# Patient Record
Sex: Male | Born: 1970 | Race: Black or African American | Hispanic: No | Marital: Single | Smoking: Current every day smoker
Health system: Southern US, Community
[De-identification: ages and names within clinical notes are randomized; demographics above are authoritative.]

## PROBLEM LIST (undated history)

## (undated) DIAGNOSIS — F4310 Post-traumatic stress disorder, unspecified: Secondary | ICD-10-CM

## (undated) DIAGNOSIS — F32A Depression, unspecified: Secondary | ICD-10-CM

## (undated) DIAGNOSIS — F329 Major depressive disorder, single episode, unspecified: Secondary | ICD-10-CM

## (undated) DIAGNOSIS — M542 Cervicalgia: Secondary | ICD-10-CM

## (undated) DIAGNOSIS — F419 Anxiety disorder, unspecified: Secondary | ICD-10-CM

---

## 2005-03-21 ENCOUNTER — Emergency Department (HOSPITAL_COMMUNITY): Admission: EM | Admit: 2005-03-21 | Discharge: 2005-03-21 | Payer: Self-pay | Admitting: Emergency Medicine

## 2012-05-28 ENCOUNTER — Encounter (HOSPITAL_BASED_OUTPATIENT_CLINIC_OR_DEPARTMENT_OTHER): Payer: Self-pay | Admitting: *Deleted

## 2012-05-28 ENCOUNTER — Emergency Department (HOSPITAL_BASED_OUTPATIENT_CLINIC_OR_DEPARTMENT_OTHER)
Admission: EM | Admit: 2012-05-28 | Discharge: 2012-05-29 | Disposition: A | Discharge status: Home / Self Care | Payer: Self-pay | Attending: Emergency Medicine | Admitting: Emergency Medicine

## 2012-05-28 DIAGNOSIS — R51 Headache: Secondary | ICD-10-CM | POA: Insufficient documentation

## 2012-05-28 DIAGNOSIS — M542 Cervicalgia: Secondary | ICD-10-CM | POA: Insufficient documentation

## 2012-05-28 HISTORY — DX: Post-traumatic stress disorder, unspecified: F43.10

## 2012-05-28 HISTORY — DX: Anxiety disorder, unspecified: F41.9

## 2012-05-28 HISTORY — DX: Depression, unspecified: F32.A

## 2012-05-28 HISTORY — DX: Major depressive disorder, single episode, unspecified: F32.9

## 2012-05-28 NOTE — ED Notes (Signed)
Pt reports he "feels like something is sliding" inside his head- also has had neck pain x 2 weeks which is worse over the four days and keeping from sleep- also c/o fatigue

## 2012-05-28 NOTE — ED Notes (Signed)
Pt reports feeling like something is sliding around in his head x 2 weeks,  Pt reports sever pain when moving head from side to side, has to move whole body, now feels like he is developing anxiety from headache. Pt reports history of head injury when in prison 10 years ago, pt's main issue is the pressure he feels inside of his head, neurologically intact,

## 2012-05-29 ENCOUNTER — Emergency Department (HOSPITAL_BASED_OUTPATIENT_CLINIC_OR_DEPARTMENT_OTHER): Payer: Self-pay

## 2012-05-29 LAB — CBC WITH DIFFERENTIAL/PLATELET
Basophils Absolute: 0.1 10*3/uL (ref 0.0–0.1)
Basophils Relative: 0 % (ref 0–1)
Eosinophils Absolute: 0.1 10*3/uL (ref 0.0–0.7)
Hemoglobin: 14.9 g/dL (ref 13.0–17.0)
MCH: 30.3 pg (ref 26.0–34.0)
MCHC: 37 g/dL — ABNORMAL HIGH (ref 30.0–36.0)
Monocytes Relative: 9 % (ref 3–12)
Neutro Abs: 8.5 10*3/uL — ABNORMAL HIGH (ref 1.7–7.7)
Neutrophils Relative %: 65 % (ref 43–77)
Platelets: 281 10*3/uL (ref 150–400)

## 2012-05-29 LAB — BASIC METABOLIC PANEL
Chloride: 105 mEq/L (ref 96–112)
GFR calc Af Amer: >90 mL/min (ref >90)
GFR calc non Af Amer: >90 mL/min (ref >90)
Potassium: 4 mEq/L (ref 3.5–5.1)
Sodium: 139 mEq/L (ref 135–145)

## 2012-05-29 MED ORDER — HYDROCODONE-ACETAMINOPHEN 5-325 MG PO TABS: 1.0000 | ORAL_TABLET | Freq: Four times a day (QID) | ORAL | Status: AC | PRN

## 2012-05-29 NOTE — ED Notes (Signed)
MD at bedside. 

## 2012-05-29 NOTE — ED Provider Notes (Signed)
History     CSN: [id number]  Arrival date & time 05/28/12  2324   First MD Initiated Contact with Patient 05/29/12 0050      Chief Complaint  Patient presents with  . Head and neck pain     (Consider location/radiation/quality/duration/timing/severity/associated sxs/prior treatment) HPI This is a 41 year old male with a one-week history of headaches and neck pain. The headaches were initially intermittent and mild but for the past 4 days have been constant. He describes the pain is severe. He describes the sensation as that of something sliding around in his head, specifically pointing to his temples. He states he is also having sinus tenderness consistent with prior episodes of sinusitis. He also states he has pain in his neck, worse with movement of the neck but not with palpation; he states this feels like muscle spasms. He denies any focal neurologic deficits. He denies fever or chills. He denies chest pain, dyspnea, abdominal pain, nausea or vomiting. He states he has felt generally weak for the past 2 months and has had intermittent diarrhea as well as constipation.  Past Medical History  Diagnosis Date  . Anxiety   . Depression   . PTSD (post-traumatic stress disorder)     History reviewed. No pertinent past surgical history.  No family history on file.  History  Substance Use Topics  . Smoking status: Current Everyday Smoker  . Smokeless tobacco: Never Used  . Alcohol Use: No      Review of Systems  All other systems reviewed and are negative.    Allergies  Review of patient's allergies indicates no known allergies.  Home Medications  No current outpatient prescriptions on file.  BP 147/83  Pulse 108  Temp 98.6 F (37 C) (Oral)  Resp 20  SpO2 98%  Physical Exam General: Well-developed, well-nourished male in no acute distress; appearance consistent with age of record HENT: normocephalic, atraumatic Eyes: pupils equal round and reactive to light;  extraocular muscles intact Neck: no soft tissue tenderness or muscle spasm palpated; pain on range of motion; no meningeal signs Heart: regular rate and rhythm Lungs: clear to auscultation bilaterally Abdomen: soft; nondistended; nontender; no masses or hepatosplenomegaly; bowel sounds present Extremities: No deformity; full range of motion\ Neurologic: Awake, alert and oriented; motor function intact in all extremities and symmetric; no facial droop Skin: Warm and dry Psychiatric: Flat affect    ED Course  Procedures (including critical care time)    MDM   Nursing notes and vitals signs, including pulse oximetry, reviewed.  Summary of this visit's results, reviewed by myself:  Labs:  Results for orders placed during the hospital encounter of 05/28/12  CBC WITH DIFFERENTIAL      Component Value Range   WBC 13.0 (*) 4.0 - 10.5 K/uL   RBC 4.92  4.22 - 5.81 MIL/uL   Hemoglobin 14.9  13.0 - 17.0 g/dL   HCT 78.2  95.6 - 21.3 %   MCV 81.9  78.0 - 100.0 fL   MCH 30.3  26.0 - 34.0 pg   MCHC 37.0 (*) 30.0 - 36.0 g/dL   RDW 08.6  57.8 - 46.9 %   Platelets 281  150 - 400 K/uL   Neutrophils Relative 65  43 - 77 %   Neutro Abs 8.5 (*) 1.7 - 7.7 K/uL   Lymphocytes Relative 25  12 - 46 %   Lymphs Abs 3.2  0.7 - 4.0 K/uL   Monocytes Relative 9  3 - 12 %  Monocytes Absolute 1.2 (*) 0.1 - 1.0 K/uL   Eosinophils Relative 1  0 - 5 %   Eosinophils Absolute 0.1  0.0 - 0.7 K/uL   Basophils Relative 0  0 - 1 %   Basophils Absolute 0.1  0.0 - 0.1 K/uL  BASIC METABOLIC PANEL      Component Value Range   Sodium 139  135 - 145 mEq/L   Potassium 4.0  3.5 - 5.1 mEq/L   Chloride 105  96 - 112 mEq/L   CO2 22  19 - 32 mEq/L   Glucose, Bld 103 (*) 70 - 99 mg/dL   BUN 23  6 - 23 mg/dL   Creatinine, Ser 1.61  0.50 - 1.35 mg/dL   Calcium 9.7  8.4 - 09.6 mg/dL   GFR calc non Af Amer >90  >90 mL/min   GFR calc Af Amer >90  >90 mL/min    Imaging Studies: Ct Head Wo Contrast  05/29/2012   *RADIOLOGY REPORT*  Clinical Data: Headache  CT HEAD WITHOUT CONTRAST  Technique:  Contiguous axial images were obtained from the base of the skull through the vertex without contrast.  Comparison: None.  Findings: No acute hemorrhage, acute infarction, or mass lesion is identified.  No midline shift.  No ventriculomegaly.  No skull fracture.  Orbits and paranasal sinuses are unremarkable.  IMPRESSION: No acute intracranial finding.  Original Report Authenticated By: Harrel Lemon, M.D.            Hanley Seamen, MD 05/29/12 [phone]

## 2012-08-07 ENCOUNTER — Emergency Department (HOSPITAL_BASED_OUTPATIENT_CLINIC_OR_DEPARTMENT_OTHER)
Admission: EM | Admit: 2012-08-07 | Discharge: 2012-08-07 | Disposition: A | Discharge status: Home / Self Care | Payer: Self-pay | Attending: Emergency Medicine | Admitting: Emergency Medicine

## 2012-08-07 ENCOUNTER — Emergency Department (HOSPITAL_BASED_OUTPATIENT_CLINIC_OR_DEPARTMENT_OTHER): Payer: Self-pay

## 2012-08-07 ENCOUNTER — Encounter (HOSPITAL_BASED_OUTPATIENT_CLINIC_OR_DEPARTMENT_OTHER): Payer: Self-pay | Admitting: Family Medicine

## 2012-08-07 DIAGNOSIS — F329 Major depressive disorder, single episode, unspecified: Secondary | ICD-10-CM | POA: Insufficient documentation

## 2012-08-07 DIAGNOSIS — J40 Bronchitis, not specified as acute or chronic: Secondary | ICD-10-CM | POA: Insufficient documentation

## 2012-08-07 DIAGNOSIS — F3289 Other specified depressive episodes: Secondary | ICD-10-CM | POA: Insufficient documentation

## 2012-08-07 DIAGNOSIS — F411 Generalized anxiety disorder: Secondary | ICD-10-CM | POA: Insufficient documentation

## 2012-08-07 DIAGNOSIS — F17200 Nicotine dependence, unspecified, uncomplicated: Secondary | ICD-10-CM | POA: Insufficient documentation

## 2012-08-07 DIAGNOSIS — J329 Chronic sinusitis, unspecified: Secondary | ICD-10-CM | POA: Insufficient documentation

## 2012-08-07 DIAGNOSIS — F4310 Post-traumatic stress disorder, unspecified: Secondary | ICD-10-CM | POA: Insufficient documentation

## 2012-08-07 HISTORY — DX: Cervicalgia: M54.2

## 2012-08-07 LAB — URINALYSIS, ROUTINE W REFLEX MICROSCOPIC
Bilirubin Urine: NEGATIVE
Leukocytes, UA: NEGATIVE
Nitrite: NEGATIVE
Specific Gravity, Urine: 1.021 (ref 1.005–1.030)
Urobilinogen, UA: 0.2 mg/dL (ref 0.0–1.0)
pH: 5.5 (ref 5.0–8.0)

## 2012-08-07 MED ORDER — ALBUTEROL SULFATE HFA 108 (90 BASE) MCG/ACT IN AERS
2.0000 | INHALATION_SPRAY | RESPIRATORY_TRACT | Status: DC | PRN
  Filled 2012-08-07: qty 6.7

## 2012-08-07 MED ORDER — ALBUTEROL SULFATE (5 MG/ML) 0.5% IN NEBU
5.0000 mg | INHALATION_SOLUTION | Freq: Once | RESPIRATORY_TRACT | Status: AC
  Administered 2012-08-07: 5 mg via RESPIRATORY_TRACT
  Filled 2012-08-07: qty 1

## 2012-08-07 MED ORDER — AMOXICILLIN 500 MG PO CAPS: 500.0000 mg | ORAL_CAPSULE | Freq: Three times a day (TID) | ORAL | Status: DC

## 2012-08-07 NOTE — ED Notes (Signed)
Pt sts he is concerned about an infection in urine and has been taking flagyl for exposure to std.

## 2012-08-07 NOTE — ED Notes (Signed)
Pt was instructed on proper MDI use with spacer. He currently has no questions. He understands it's use and demonstrated it well. Rt will continue to monitor.

## 2012-08-07 NOTE — ED Provider Notes (Signed)
History     CSN: [id number]  Arrival date & time 08/07/12  4098   First MD Initiated Contact with Patient 08/07/12 1039      Chief Complaint  Patient presents with  . Generalized Body Aches    (Consider location/radiation/quality/duration/timing/severity/associated sxs/prior treatment) HPI Pt presents with c/o cough, sinus congestion, facial pain and headache.  Symptoms have been present over the past 6 days.  Past 2 days he has developed a fever.  No vomiting.  He has tried sudafed OTC and this has provided some mild relief.  Pt is a smoker.  No chest pain or sob.  No known sick contacts.  There are no other associated systemic symptoms, there are no other alleviating or modifying factors.   Past Medical History  Diagnosis Date  . Anxiety   . Depression   . PTSD (post-traumatic stress disorder)   . Neck pain     History reviewed. No pertinent past surgical history.  No family history on file.  History  Substance Use Topics  . Smoking status: Current Every Day Smoker  . Smokeless tobacco: Never Used  . Alcohol Use: No      Review of Systems ROS reviewed and all otherwise negative except for mentioned in HPI  Allergies  Review of patient's allergies indicates no known allergies.  Home Medications   Current Outpatient Rx  Name Route Sig Dispense Refill  . HYDROCODONE-ACETAMINOPHEN 5-325 MG PO TABS Oral Take 1 tablet by mouth every 6 (six) hours as needed.    . FLAGYL PO Oral Take by mouth.    . NYQUIL PO Oral Take by mouth.    Marland Kitchen PSEUDOEPHEDRINE HCL PO Oral Take by mouth.    . AMOXICILLIN 500 MG PO CAPS Oral Take 1 capsule (500 mg total) by mouth 3 (three) times daily. 21 capsule 0    BP 139/93  Pulse 85  Temp 98 F (36.7 C) (Oral)  Resp 20  Ht 5\' 11"  (1.803 m)  Wt 231 lb (104.781 kg)  BMI 32.22 kg/m2  SpO2 100% Vitals reviewed Physical Exam Physical Examination: General appearance - alert, well appearing, and in no distress Mental status - alert,  oriented to person, place, and time Eyes - pupils equal and reactive, extraocular eye movements intact Mouth - mucous membranes moist, pharynx normal without lesions Neck - supple, no significant adenopathy Chest - bilateral expiratory coarse wheeing, no retraction or increased respiratory effort, no crackles or rhonchi Heart - normal rate, regular rhythm, normal S1, S2, no murmurs, rubs, clicks or gallops Abdomen - soft, nontender, nondistended, no masses or organomegaly, nabs Extremities - peripheral pulses normal, no pedal edema, no clubbing or cyanosis Skin - normal coloration and turgor, no rashes  ED Course  Procedures (including critical care time)   Labs Reviewed  URINALYSIS, ROUTINE W REFLEX MICROSCOPIC   Dg Chest 2 View  08/07/2012  *RADIOLOGY REPORT*  Clinical Data: Generalized body aches.  CHEST - 2 VIEW  Comparison: None.  Findings: Mild central peribronchial thickening.  No confluent airspace opacity.  No pleural effusion or pneumothorax. Cardiomediastinal contours within normal range.  No acute osseous finding.  IMPRESSION: Mild peribronchial thickening can be seen with viral infection or bronchitis.  No confluent airspace opacity.   Original Report Authenticated By: Waneta Martins, M.D.      1. Bronchitis   2. Sinusitis       MDM  Pt presents with c/o cough, nasal congestion, facial pain.  He has wheezing on exam- advised to  stop smoking.  Pt given albuterol neb, also given albuterol MDI.  Also ttp over frontal and maxillary sinuses.  Pt started on amoxicillin for sinusitis as well.  Discharged with strict return precautions.  Pt agreeable with plan.        Ethelda Chick, MD 08/07/12 1141

## 2012-08-07 NOTE — ED Notes (Signed)
Pt c/o fever at home, body aches, nasal congestion and 2 episodes of diarrhea. Pt also c/o cough and sts "I hope I don't have pneumonia".

## 2012-10-13 ENCOUNTER — Emergency Department (HOSPITAL_BASED_OUTPATIENT_CLINIC_OR_DEPARTMENT_OTHER)
Admission: EM | Admit: 2012-10-13 | Discharge: 2012-10-13 | Discharge status: Against Medical Advice | Payer: Self-pay | Attending: Emergency Medicine | Admitting: Emergency Medicine

## 2012-10-13 DIAGNOSIS — Z029 Encounter for administrative examinations, unspecified: Secondary | ICD-10-CM | POA: Insufficient documentation

## 2012-10-14 ENCOUNTER — Emergency Department (HOSPITAL_COMMUNITY)
Admission: EM | Admit: 2012-10-14 | Discharge: 2012-10-14 | Disposition: A | Discharge status: Home / Self Care | Payer: Medicaid Other | Source: Home / Self Care | Attending: Emergency Medicine | Admitting: Emergency Medicine

## 2012-10-14 ENCOUNTER — Encounter (HOSPITAL_COMMUNITY): Payer: Self-pay | Admitting: Emergency Medicine

## 2012-10-14 DIAGNOSIS — J329 Chronic sinusitis, unspecified: Secondary | ICD-10-CM

## 2012-10-14 MED ORDER — FLUTICASONE PROPIONATE 50 MCG/ACT NA SUSP: 2.0000 | Freq: Every day | NASAL | Status: AC

## 2012-10-14 MED ORDER — AMOXICILLIN-POT CLAVULANATE 875-125 MG PO TABS: 1.0000 | ORAL_TABLET | Freq: Two times a day (BID) | ORAL | Status: DC

## 2012-10-14 NOTE — ED Provider Notes (Signed)
History     CSN: [id number]  Arrival date & time 10/14/12  1252   First MD Initiated Contact with Patient 10/14/12 1502      Chief Complaint  Patient presents with  . Facial Pain    sinus pressure, yellow/green nasal drainage, headache, left ear pain    (Consider location/radiation/quality/duration/timing/severity/associated sxs/prior treatment) HPI Comments: Pt with sinus pain and pressure, getting worse for 2.5 weeks. C/o green/brown sputum/discharge.   Patient is a 41 y.o. male presenting with URI. The history is provided by the patient.  URI The primary symptoms include headaches. Primary symptoms do not include fever, ear pain, sore throat or cough. Episode onset: 2.5 weeks ago. This is a new problem. The problem has been gradually worsening.  Symptoms associated with the illness include chills, facial pain, sinus pressure and congestion.    Past Medical History  Diagnosis Date  . Anxiety   . Depression   . PTSD (post-traumatic stress disorder)   . Neck pain     History reviewed. No pertinent past surgical history.  History reviewed. No pertinent family history.  History  Substance Use Topics  . Smoking status: Current Every Day Smoker  . Smokeless tobacco: Never Used  . Alcohol Use: No      Review of Systems  Constitutional: Positive for chills. Negative for fever.  HENT: Positive for congestion and sinus pressure. Negative for ear pain and sore throat.   Respiratory: Negative for cough.   Neurological: Positive for headaches.    Allergies  Review of patient's allergies indicates no known allergies.  Home Medications   Current Outpatient Rx  Name  Route  Sig  Dispense  Refill  . AMOXICILLIN 500 MG PO CAPS   Oral   Take 1 capsule (500 mg total) by mouth 3 (three) times daily.   21 capsule   0   . AMOXICILLIN-POT CLAVULANATE 875-125 MG PO TABS   Oral   Take 1 tablet by mouth 2 (two) times daily.   20 tablet   0   . FLUTICASONE PROPIONATE 50  MCG/ACT NA SUSP   Nasal   Place 2 sprays into the nose daily. Into each nostril   16 g   2   . HYDROCODONE-ACETAMINOPHEN 5-325 MG PO TABS   Oral   Take 1 tablet by mouth every 6 (six) hours as needed.         . FLAGYL PO   Oral   Take by mouth.         . NYQUIL PO   Oral   Take by mouth.         Marland Kitchen PSEUDOEPHEDRINE HCL PO   Oral   Take by mouth.           BP 137/89  Pulse 80  Temp 98.4 F (36.9 C) (Oral)  Resp 16  SpO2 97%  Physical Exam  Constitutional: He appears well-developed and well-nourished.       Uncomfortable appearing  HENT:  Right Ear: Tympanic membrane, external ear and ear canal normal.  Left Ear: Tympanic membrane, external ear and ear canal normal.  Nose: Mucosal edema present. Right sinus exhibits maxillary sinus tenderness and frontal sinus tenderness. Left sinus exhibits maxillary sinus tenderness and frontal sinus tenderness.  Mouth/Throat: Oropharynx is clear and moist.  Cardiovascular: Normal rate and regular rhythm.   Pulmonary/Chest: Effort normal.    ED Course  Procedures (including critical care time)  Labs Reviewed - No data to display No results found.  1. Sinusitis       MDM         Cathlyn Parsons, NP 10/14/12 1507

## 2012-10-14 NOTE — ED Notes (Addendum)
Pt c/o sinus pressure with green/yellow nasal drainage and headache. Left ear pain and stuffiness, fatigue,dizziness.  Nausea from nasal drainage. Symptoms are gradually getting worse. Pt denies fever and any other symptoms.   Pt states was treated in oct. For similar symptoms and was given an antibiotic and sudafed with only mild relief in symptoms.  Symptoms have been getting progressively worse over the past two weeks

## 2012-10-14 NOTE — ED Provider Notes (Signed)
Medical screening examination/treatment/procedure(s) were performed by non-physician practitioner and as supervising physician I was immediately available for consultation/collaboration.  Raynald Blend, MD 10/14/12 [phone]

## 2013-02-01 ENCOUNTER — Emergency Department (INDEPENDENT_AMBULATORY_CARE_PROVIDER_SITE_OTHER)
Admission: EM | Admit: 2013-02-01 | Discharge: 2013-02-01 | Disposition: A | Discharge status: Home / Self Care | Payer: Medicaid Other | Source: Home / Self Care | Attending: Family Medicine | Admitting: Family Medicine

## 2013-02-01 ENCOUNTER — Emergency Department (INDEPENDENT_AMBULATORY_CARE_PROVIDER_SITE_OTHER): Payer: Medicaid Other

## 2013-02-01 ENCOUNTER — Encounter (HOSPITAL_COMMUNITY): Payer: Self-pay | Admitting: Emergency Medicine

## 2013-02-01 DIAGNOSIS — J329 Chronic sinusitis, unspecified: Secondary | ICD-10-CM

## 2013-02-01 DIAGNOSIS — J029 Acute pharyngitis, unspecified: Secondary | ICD-10-CM

## 2013-02-01 MED ORDER — OMEPRAZOLE 20 MG PO CPDR: 20.0000 mg | DELAYED_RELEASE_CAPSULE | Freq: Every day | ORAL | Status: DC

## 2013-02-01 MED ORDER — GI COCKTAIL ~~LOC~~
ORAL | Status: AC
  Filled 2013-02-01: qty 30

## 2013-02-01 MED ORDER — PREDNISONE 20 MG PO TABS: ORAL_TABLET | ORAL | Status: AC

## 2013-02-01 MED ORDER — AMOXICILLIN 500 MG PO CAPS: 500.0000 mg | ORAL_CAPSULE | Freq: Three times a day (TID) | ORAL | Status: AC

## 2013-02-01 MED ORDER — GI COCKTAIL ~~LOC~~
30.0000 mL | Freq: Once | ORAL | Status: AC
  Administered 2013-02-01: 30 mL via ORAL

## 2013-02-01 MED ORDER — HYDROCODONE-ACETAMINOPHEN 5-325 MG PO TABS: 2.0000 | ORAL_TABLET | ORAL | Status: DC | PRN

## 2013-02-01 NOTE — ED Notes (Signed)
Pt c/o ear ache x 2 to 3 days and sore throat. Pt states that two hours ago he started to have sharp pain with swallowing  And feels like throat is closing. Symptoms are gradually getting worse.  Pt denies fever.sob.cough. Pt is sitting up right and still experiencing pain with swallowing.   Provider notified of pt's condition.

## 2013-02-02 ENCOUNTER — Emergency Department (HOSPITAL_BASED_OUTPATIENT_CLINIC_OR_DEPARTMENT_OTHER)
Admission: EM | Admit: 2013-02-02 | Discharge: 2013-02-02 | Disposition: A | Discharge status: Home / Self Care | Payer: Medicaid Other | Attending: Emergency Medicine | Admitting: Emergency Medicine

## 2013-02-02 ENCOUNTER — Encounter (HOSPITAL_BASED_OUTPATIENT_CLINIC_OR_DEPARTMENT_OTHER): Payer: Self-pay | Admitting: Emergency Medicine

## 2013-02-02 DIAGNOSIS — Z8659 Personal history of other mental and behavioral disorders: Secondary | ICD-10-CM | POA: Insufficient documentation

## 2013-02-02 DIAGNOSIS — F17200 Nicotine dependence, unspecified, uncomplicated: Secondary | ICD-10-CM | POA: Insufficient documentation

## 2013-02-02 DIAGNOSIS — G8929 Other chronic pain: Secondary | ICD-10-CM | POA: Insufficient documentation

## 2013-02-02 DIAGNOSIS — J029 Acute pharyngitis, unspecified: Secondary | ICD-10-CM | POA: Insufficient documentation

## 2013-02-02 DIAGNOSIS — M542 Cervicalgia: Secondary | ICD-10-CM | POA: Insufficient documentation

## 2013-02-02 MED ORDER — GI COCKTAIL ~~LOC~~: 30.0000 mL | Freq: Once | ORAL | Status: DC

## 2013-02-02 NOTE — ED Notes (Signed)
MD at bedside. 

## 2013-02-02 NOTE — ED Notes (Signed)
Pt c/o sinus drainage, pt educated about taking Claritin or sudafed OTC to help with sinus drainage.

## 2013-02-02 NOTE — ED Provider Notes (Signed)
History     CSN: [id number]  Arrival date & time 02/01/13  4098   First MD Initiated Contact with Patient 02/01/13 1814      Chief Complaint  Patient presents with  . Oral Swelling    (Consider location/radiation/quality/duration/timing/severity/associated sxs/prior treatment) HPI Comments: 42 y/o male smoker with h/o recurrent sinusitis here c/o sinus pressure, rhinorrhea, headache, sore throat and pain with swallowing for 2 days. Pain with swallowing started few hours ago and feels his "throat is closing". Denies cough, chest pain or shortness of breath. No fever or chills. Denies h/o of swallowing a foreign body.   Past Medical History  Diagnosis Date  . Anxiety   . Depression   . PTSD (post-traumatic stress disorder)   . Neck pain     History reviewed. No pertinent past surgical history.  History reviewed. No pertinent family history.  History  Substance Use Topics  . Smoking status: Current Every Day Smoker  . Smokeless tobacco: Never Used  . Alcohol Use: No      Review of Systems  Constitutional: Negative for fever, chills, diaphoresis, activity change, appetite change and fatigue.  HENT: Positive for congestion, sore throat, trouble swallowing and sinus pressure.   Respiratory: Negative for cough, chest tightness, shortness of breath, wheezing and stridor.   Gastrointestinal: Negative for nausea, vomiting and abdominal pain.    Allergies  Review of patient's allergies indicates no known allergies.  Home Medications   Current Outpatient Rx  Name  Route  Sig  Dispense  Refill  . amoxicillin (AMOXIL) 500 MG capsule   Oral   Take 1 capsule (500 mg total) by mouth 3 (three) times daily.   21 capsule   0   . fluticasone (FLONASE) 50 MCG/ACT nasal spray   Nasal   Place 2 sprays into the nose daily. Into each nostril   16 g   2   . HYDROcodone-acetaminophen (NORCO/VICODIN) 5-325 MG per tablet   Oral   Take 2 tablets by mouth every 4 (four) hours as  needed for pain.   6 tablet   0   . omeprazole (PRILOSEC) 20 MG capsule   Oral   Take 1 capsule (20 mg total) by mouth daily.   30 capsule   0   . predniSONE (DELTASONE) 20 MG tablet      2 tabs by mouth daily for 5 days   10 tablet   0     BP 151/100  Pulse 108  Temp(Src) 98.2 F (36.8 C) (Oral)  Resp 20  SpO2 100%  Physical Exam  Nursing note and vitals reviewed. Constitutional: He is oriented to person, place, and time. He appears well-developed and well-nourished. No distress.  HENT:  Head: Normocephalic and atraumatic.  Right Ear: External ear normal.  Left Ear: External ear normal.  Mouth/Throat: Oropharynx is clear and moist.  Nasal Congestion with erythema and swelling of nasal turbinates, white rhinorrhea. Mild pharyngeal erythema no exudates. No uvula deviation. No trismus. No drooling. TM's normal.  Neck: Neck supple. No JVD present. No tracheal deviation present. No thyromegaly present.  Cardiovascular: Normal rate, regular rhythm and normal heart sounds.   Pulmonary/Chest: Effort normal and breath sounds normal. No respiratory distress. He has no wheezes. He has no rales. He exhibits no tenderness.  Lymphadenopathy:    He has no cervical adenopathy.  Neurological: He is alert and oriented to person, place, and time.  Skin: No rash noted. He is not diaphoretic.    ED Course  Procedures (including critical care time)  Labs Reviewed  POCT RAPID STREP A ([facility] ONLY)   Dg Neck Soft Tissue  02/01/2013  *RADIOLOGY REPORT*  Clinical Data: Throat pain and swelling.  Foreign body sensation.  NECK SOFT TISSUES - 1+ VIEW  Comparison: None  Findings: The prevertebral soft tissues, epiglottis and aryepiglottic folds are unremarkable. There is no evidence of radiopaque foreign body. The visualized cervical spine is unremarkable. No significant abnormalities are noted.  IMPRESSION: Unremarkable exam.   Original Report Authenticated By: Harmon Pier, M.D.       1. Sinusitis   2. Pharyngitis       MDM  normal soft tissue neck Xrays. Negative strep.  Patient reports improvement of dysphagia after lidocaine containing GI cocktail. Possible pharyngeal erosion or esophageal spasms in this smoker patient. Prescribed omeprazole. Sinusitis treated with amoxicillin. Prednisone, norco. Ask to follow up with PCP to monitor symptoms as he might require GI referral if persistent symptoms. Supportive care and red flags that should prompt return to medical attention discussed with patient and provided in writing.          Sharin Grave, MD 02/02/13 [phone]

## 2013-02-02 NOTE — ED Notes (Signed)
Pt found prescriptions from his earlier visit in his discharge paperwork from [facility].  Pt sts he does not want any further treatment or xrays and will go get his prescriptions filled.

## 2013-02-02 NOTE — ED Provider Notes (Signed)
History     CSN: [id number]  Arrival date & time 02/02/13  0133   None     Chief Complaint  Patient presents with  . Sore Throat    (Consider location/radiation/quality/duration/timing/severity/associated sxs/prior treatment) Patient is a 42 y.o. male presenting with pharyngitis. The history is provided by the patient. No language interpreter was used.  Sore Throat This is a recurrent problem. The current episode started yesterday. The problem occurs constantly. The problem has not changed since onset.Pertinent negatives include no chest pain, no abdominal pain, no headaches and no shortness of breath. Nothing relieves the symptoms. He has tried nothing for the symptoms. The treatment provided no relief.  Patient with a history of chronic neck pain.  Seen at 622 pm this evening at Urgent Care for the same symptoms and had negative strep test and negative soft tissue of the neck he presents for same this evening no change in symptoms since that time but felt like he should have gotten antibiotics so he presented to ED.  No fevers, no change in voice, no drooling.  No neck stiffness, no rash no photophobia.    Past Medical History  Diagnosis Date  . Anxiety   . Depression   . PTSD (post-traumatic stress disorder)   . Neck pain     History reviewed. No pertinent past surgical history.  No family history on file.  History  Substance Use Topics  . Smoking status: Current Every Day Smoker  . Smokeless tobacco: Never Used  . Alcohol Use: No      Review of Systems  Constitutional: Negative for fever.  HENT: Positive for sore throat. Negative for facial swelling, drooling, mouth sores, trouble swallowing, neck stiffness and voice change.   Respiratory: Negative for shortness of breath, wheezing and stridor.   Cardiovascular: Negative for chest pain.  Gastrointestinal: Negative for abdominal pain.  Neurological: Negative for headaches.  All other systems reviewed and are  negative.    Allergies  Review of patient's allergies indicates no known allergies.  Home Medications   Current Outpatient Rx  Name  Route  Sig  Dispense  Refill  . amoxicillin (AMOXIL) 500 MG capsule   Oral   Take 1 capsule (500 mg total) by mouth 3 (three) times daily.   21 capsule   0   . fluticasone (FLONASE) 50 MCG/ACT nasal spray   Nasal   Place 2 sprays into the nose daily. Into each nostril   16 g   2   . HYDROcodone-acetaminophen (NORCO/VICODIN) 5-325 MG per tablet   Oral   Take 2 tablets by mouth every 4 (four) hours as needed for pain.   6 tablet   0   . omeprazole (PRILOSEC) 20 MG capsule   Oral   Take 1 capsule (20 mg total) by mouth daily.   30 capsule   0   . predniSONE (DELTASONE) 20 MG tablet      2 tabs by mouth daily for 5 days   10 tablet   0     BP 151/95  Pulse 92  Temp(Src) 98.1 F (36.7 C) (Oral)  Resp 18  Ht 5\' 11"  (1.803 m)  Wt 231 lb (104.781 kg)  BMI 32.23 kg/m2  SpO2 99%  Physical Exam  Constitutional: He is oriented to person, place, and time. He appears well-developed and well-nourished. No distress.  HENT:  Head: Normocephalic and atraumatic.  Mouth/Throat: Oropharynx is clear and moist. No oropharyngeal exudate.  No swelling of the lips tongue  or uvula.  No pharyngeal edema no drooling normal phonation.  No exudate  Eyes: Conjunctivae are normal. Pupils are equal, round, and reactive to light.  Neck: Normal range of motion. Neck supple. No tracheal deviation present.  No pain with displacement of the trachea  Cardiovascular: Normal rate, regular rhythm and intact distal pulses.   Pulmonary/Chest: Effort normal and breath sounds normal. No stridor. No respiratory distress. He has no wheezes. He has no rales. He exhibits no tenderness.  Abdominal: Soft. Bowel sounds are normal.  Musculoskeletal: Normal range of motion.  Lymphadenopathy:    He has no cervical adenopathy.  Neurological: He is alert and oriented to  person, place, and time.  Skin: Skin is warm and dry.  Psychiatric: His affect is blunt.    ED Course  Procedures (including critical care time)  Labs Reviewed - No data to display Dg Neck Soft Tissue  02/01/2013  *RADIOLOGY REPORT*  Clinical Data: Throat pain and swelling.  Foreign body sensation.  NECK SOFT TISSUES - 1+ VIEW  Comparison: None  Findings: The prevertebral soft tissues, epiglottis and aryepiglottic folds are unremarkable. There is no evidence of radiopaque foreign body. The visualized cervical spine is unremarkable. No significant abnormalities are noted.  IMPRESSION: Unremarkable exam.   Original Report Authenticated By: Harmon Pier, M.D.      No diagnosis found.    MDM  Given symptoms EDP planned pain medication and repeat xray to make sure no changes.  Patient apparently realized he has been prescribed antibiotic and steroids and left the ED before this could occur.          Jasmine Awe, MD 02/02/13 [phone]

## 2013-02-02 NOTE — ED Notes (Signed)
Pt c/o throat pain and painful swallowing. Pt has already been seen at [facility] for same and given rx for hydrocodone for pain but states he doesn't think he can swallow the pill. Pt also thinks he needs an antibiotic. Pt had xray done earlier as well.

## 2014-10-07 ENCOUNTER — Emergency Department (HOSPITAL_BASED_OUTPATIENT_CLINIC_OR_DEPARTMENT_OTHER)
Admission: EM | Admit: 2014-10-07 | Discharge: 2014-10-07 | Disposition: A | Discharge status: Home / Self Care | Payer: Medicaid Other | Attending: Emergency Medicine | Admitting: Emergency Medicine

## 2014-10-07 ENCOUNTER — Encounter (HOSPITAL_BASED_OUTPATIENT_CLINIC_OR_DEPARTMENT_OTHER): Payer: Self-pay | Admitting: *Deleted

## 2014-10-07 ENCOUNTER — Emergency Department (HOSPITAL_BASED_OUTPATIENT_CLINIC_OR_DEPARTMENT_OTHER): Payer: Medicaid Other

## 2014-10-07 DIAGNOSIS — R51 Headache: Secondary | ICD-10-CM | POA: Insufficient documentation

## 2014-10-07 DIAGNOSIS — Z792 Long term (current) use of antibiotics: Secondary | ICD-10-CM | POA: Diagnosis not present

## 2014-10-07 DIAGNOSIS — Z720 Tobacco use: Secondary | ICD-10-CM | POA: Insufficient documentation

## 2014-10-07 DIAGNOSIS — Z7952 Long term (current) use of systemic steroids: Secondary | ICD-10-CM | POA: Diagnosis not present

## 2014-10-07 DIAGNOSIS — Z791 Long term (current) use of non-steroidal anti-inflammatories (NSAID): Secondary | ICD-10-CM | POA: Insufficient documentation

## 2014-10-07 DIAGNOSIS — F419 Anxiety disorder, unspecified: Secondary | ICD-10-CM | POA: Diagnosis not present

## 2014-10-07 DIAGNOSIS — B349 Viral infection, unspecified: Secondary | ICD-10-CM | POA: Insufficient documentation

## 2014-10-07 DIAGNOSIS — R519 Headache, unspecified: Secondary | ICD-10-CM

## 2014-10-07 LAB — CBC WITH DIFFERENTIAL/PLATELET
BASOS PCT: 1 % (ref 0–1)
Basophils Absolute: 0.1 10*3/uL (ref 0.0–0.1)
Eosinophils Absolute: 0 10*3/uL (ref 0.0–0.7)
Eosinophils Relative: 0 % (ref 0–5)
HEMATOCRIT: 42.6 % (ref 39.0–52.0)
HEMOGLOBIN: 15.3 g/dL (ref 13.0–17.0)
Lymphocytes Relative: 12 % (ref 12–46)
Lymphs Abs: 1.5 10*3/uL (ref 0.7–4.0)
MCH: 30.1 pg (ref 26.0–34.0)
MCHC: 35.9 g/dL (ref 30.0–36.0)
MCV: 83.9 fL (ref 78.0–100.0)
MONO ABS: 0.9 10*3/uL (ref 0.1–1.0)
MONOS PCT: 7 % (ref 3–12)
NEUTROS ABS: 10.4 10*3/uL — AB (ref 1.7–7.7)
Neutrophils Relative %: 80 % — ABNORMAL HIGH (ref 43–77)
Platelets: 283 10*3/uL (ref 150–400)
RBC: 5.08 MIL/uL (ref 4.22–5.81)
RDW: 13.2 % (ref 11.5–15.5)
WBC: 12.8 10*3/uL — ABNORMAL HIGH (ref 4.0–10.5)

## 2014-10-07 LAB — BASIC METABOLIC PANEL
Anion gap: 11 (ref 5–15)
BUN: 18 mg/dL (ref 6–23)
CHLORIDE: 100 meq/L (ref 96–112)
CO2: 25 mEq/L (ref 19–32)
CREATININE: 0.8 mg/dL (ref 0.50–1.35)
Calcium: 9.7 mg/dL (ref 8.4–10.5)
GFR calc non Af Amer: >90 mL/min (ref >90)
Glucose, Bld: 113 mg/dL — ABNORMAL HIGH (ref 70–99)
Potassium: 4.5 mEq/L (ref 3.7–5.3)
Sodium: 136 mEq/L — ABNORMAL LOW (ref 137–147)

## 2014-10-07 MED ORDER — ONDANSETRON 4 MG PO TBDP
4.0000 mg | ORAL_TABLET | Freq: Once | ORAL | Status: AC
  Administered 2014-10-07: 4 mg via ORAL
  Filled 2014-10-07: qty 1

## 2014-10-07 MED ORDER — KETOROLAC TROMETHAMINE 60 MG/2ML IM SOLN
60.0000 mg | Freq: Once | INTRAMUSCULAR | Status: AC
  Administered 2014-10-07: 60 mg via INTRAMUSCULAR
  Filled 2014-10-07: qty 2

## 2014-10-07 MED ORDER — TRAMADOL HCL 50 MG PO TABS: 50.0000 mg | ORAL_TABLET | Freq: Four times a day (QID) | ORAL | Status: AC | PRN

## 2014-10-07 MED ORDER — NAPROXEN 500 MG PO TABS: 500.0000 mg | ORAL_TABLET | Freq: Two times a day (BID) | ORAL | Status: DC

## 2014-10-07 MED ORDER — PROMETHAZINE HCL 25 MG PO TABS: 25.0000 mg | ORAL_TABLET | Freq: Four times a day (QID) | ORAL | Status: AC | PRN

## 2014-10-07 NOTE — Discharge Instructions (Signed)
Workup for the headache and bodyaches and mild sore throat without significant findings. Suspect that this is a viral illness working on you. Head CT was negative. No evidence of any significant sinus infection. Take the Naprosyn as directed. Can supplement with the tramadol as needed for additional pain relief. Take the Phenergan as needed for nausea and vomiting. Work note provided. Return for any new or worse symptoms.

## 2014-10-07 NOTE — ED Notes (Signed)
Patient transported to CT 

## 2014-10-07 NOTE — ED Provider Notes (Signed)
CSN: [id number]     Arrival date & time 10/07/14  1409 History   First MD Initiated Contact with Patient 10/07/14 1511     Chief Complaint  Patient presents with  . Headache     (Consider location/radiation/quality/duration/timing/severity/associated sxs/prior Treatment) Patient is a 43 y.o. male presenting with headaches. The history is provided by the patient.  Headache Associated symptoms: congestion, myalgias and nausea   Associated symptoms: no abdominal pain, no diarrhea, no numbness and no vomiting    Patient with a history of PTSD and some chronic headaches. But with a new headache that is behind both eyes in the for head area radiating to the back. That started yesterday. Associated with some chills no fever nausea no vomiting no neck pain. But does have body aches. Also has a mild sore throat. Headache is described as 8 out of 10. Gradual onset slowly getting worse. Aching nature.  Past Medical History  Diagnosis Date  . Anxiety   . Depression   . PTSD (post-traumatic stress disorder)   . Neck pain    History reviewed. No pertinent past surgical history. No family history on file. History  Substance Use Topics  . Smoking status: Current Every Day Smoker -- 0.50 packs/day    Types: Cigarettes  . Smokeless tobacco: Never Used  . Alcohol Use: No    Review of Systems  Constitutional: Positive for chills.  HENT: Positive for congestion.   Eyes: Negative for redness and visual disturbance.  Respiratory: Negative for shortness of breath.   Cardiovascular: Negative for chest pain.  Gastrointestinal: Positive for nausea. Negative for vomiting, abdominal pain and diarrhea.  Genitourinary: Negative for dysuria.  Musculoskeletal: Positive for myalgias.  Skin: Negative for rash.  Neurological: Positive for headaches. Negative for weakness and numbness.  Hematological: Does not bruise/bleed easily.  Psychiatric/Behavioral: Negative for confusion.      Allergies   Review of patient's allergies indicates no known allergies.  Home Medications   Prior to Admission medications   Medication Sig Start Date End Date Taking? Authorizing Provider  amoxicillin (AMOXIL) 500 MG capsule Take 1 capsule (500 mg total) by mouth 3 (three) times daily. 02/01/13   Adlih Moreno-Coll, MD  fluticasone (FLONASE) 50 MCG/ACT nasal spray Place 2 sprays into the nose daily. Into each nostril 10/14/12   Cathlyn ParsonsAngela M Kabbe, NP  HYDROcodone-acetaminophen (NORCO/VICODIN) 5-325 MG per tablet Take 2 tablets by mouth every 4 (four) hours as needed for pain. 02/01/13   Adlih Moreno-Coll, MD  naproxen (NAPROSYN) 500 MG tablet Take 1 tablet (500 mg total) by mouth 2 (two) times daily. 10/07/14   Vanetta MuldersScott Demitris Pokorny, MD  omeprazole (PRILOSEC) 20 MG capsule Take 1 capsule (20 mg total) by mouth daily. 02/01/13   Adlih Moreno-Coll, MD  predniSONE (DELTASONE) 20 MG tablet 2 tabs by mouth daily for 5 days 02/01/13   Sharin GraveAdlih Moreno-Coll, MD  promethazine (PHENERGAN) 25 MG tablet Take 1 tablet (25 mg total) by mouth every 6 (six) hours as needed for nausea or vomiting. 10/07/14   Vanetta MuldersScott Alauna Hayden, MD  traMADol (ULTRAM) 50 MG tablet Take 1 tablet (50 mg total) by mouth every 6 (six) hours as needed. 10/07/14   Vanetta MuldersScott Keshona Kartes, MD   BP 139/92 mmHg  Pulse 92  Temp(Src) 97.6 F (36.4 C) (Oral)  Resp 18  SpO2 99% Physical Exam  Constitutional: He is oriented to person, place, and time. He appears well-developed and well-nourished. No distress.  HENT:  Head: Normocephalic and atraumatic.  Mouth/Throat: Oropharynx is clear and  moist. No oropharyngeal exudate.  Eyes: Conjunctivae and EOM are normal. Pupils are equal, round, and reactive to light.  Neck: Normal range of motion.  Cardiovascular: Normal rate, regular rhythm and normal heart sounds.   Pulmonary/Chest: Effort normal and breath sounds normal. No respiratory distress.  Abdominal: Soft. Bowel sounds are normal. There is no tenderness.   Musculoskeletal: Normal range of motion. He exhibits no edema.  Neurological: He is alert and oriented to person, place, and time. No cranial nerve deficit. He exhibits normal muscle tone. Coordination normal.  Skin: Skin is warm. No rash noted.  Nursing note and vitals reviewed.   ED Course  Procedures (including critical care time) Labs Review Labs Reviewed  CBC WITH DIFFERENTIAL - Abnormal; Notable for the following:    WBC 12.8 (*)    Neutrophils Relative % 80 (*)    Neutro Abs 10.4 (*)    All other components within normal limits  BASIC METABOLIC PANEL - Abnormal; Notable for the following:    Sodium 136 (*)    Glucose, Bld 113 (*)    All other components within normal limits    Imaging Review Ct Head Wo Contrast  10/07/2014   CLINICAL DATA:  Headache with worsening last 2 days, no injury  EXAM: CT HEAD WITHOUT CONTRAST  TECHNIQUE: Contiguous axial images were obtained from the base of the skull through the vertex without intravenous contrast.  COMPARISON:  None.  FINDINGS: No skull fracture is noted. Paranasal sinuses and mastoid air cells are unremarkable. No intracranial hemorrhage, mass effect or midline shift.  No acute infarction. No mass lesion is noted on this unenhanced scan. No hydrocephalus. The gray and white-matter differentiation is preserved.  IMPRESSION: No acute intracranial abnormality.   Electronically Signed   By: Natasha MeadLiviu  Pop M.D.   On: 10/07/2014 15:56     EKG Interpretation None      MDM   Final diagnoses:  Headache  Viral illness    Head CT negative for any evidence of sinusitis or any other acute abnormalities. Based on the headache nausea chills and some body aches and mild sore throat suspect this is a viral illness. Patient nontoxic no acute distress. Will treat with Naprosyn and supplement with tramadol and also treat the nausea with Phenergan. Patient will return for any new or worse symptoms. Work note provided.    Vanetta MuldersScott Kuzey Ogata,  MD 10/07/14 [phone]

## 2014-10-07 NOTE — ED Notes (Addendum)
Headache, nausea and chills since yesterday. States he had a flu shot a week ago.

## 2015-07-10 ENCOUNTER — Encounter (HOSPITAL_BASED_OUTPATIENT_CLINIC_OR_DEPARTMENT_OTHER): Payer: Self-pay | Admitting: *Deleted

## 2015-07-10 ENCOUNTER — Emergency Department (HOSPITAL_BASED_OUTPATIENT_CLINIC_OR_DEPARTMENT_OTHER)
Admission: EM | Admit: 2015-07-10 | Discharge: 2015-07-10 | Disposition: A | Discharge status: Home / Self Care | Payer: Commercial Managed Care - HMO | Attending: Emergency Medicine | Admitting: Emergency Medicine

## 2015-07-10 DIAGNOSIS — Z792 Long term (current) use of antibiotics: Secondary | ICD-10-CM | POA: Insufficient documentation

## 2015-07-10 DIAGNOSIS — Z7951 Long term (current) use of inhaled steroids: Secondary | ICD-10-CM | POA: Insufficient documentation

## 2015-07-10 DIAGNOSIS — F419 Anxiety disorder, unspecified: Secondary | ICD-10-CM | POA: Insufficient documentation

## 2015-07-10 DIAGNOSIS — S161XXA Strain of muscle, fascia and tendon at neck level, initial encounter: Secondary | ICD-10-CM

## 2015-07-10 DIAGNOSIS — Y998 Other external cause status: Secondary | ICD-10-CM | POA: Insufficient documentation

## 2015-07-10 DIAGNOSIS — F329 Major depressive disorder, single episode, unspecified: Secondary | ICD-10-CM | POA: Diagnosis not present

## 2015-07-10 DIAGNOSIS — S199XXA Unspecified injury of neck, initial encounter: Secondary | ICD-10-CM | POA: Diagnosis present

## 2015-07-10 DIAGNOSIS — Y9389 Activity, other specified: Secondary | ICD-10-CM | POA: Diagnosis not present

## 2015-07-10 DIAGNOSIS — Z720 Tobacco use: Secondary | ICD-10-CM | POA: Insufficient documentation

## 2015-07-10 DIAGNOSIS — Y92410 Unspecified street and highway as the place of occurrence of the external cause: Secondary | ICD-10-CM | POA: Diagnosis not present

## 2015-07-10 MED ORDER — METHOCARBAMOL 500 MG PO TABS: 500.0000 mg | ORAL_TABLET | Freq: Two times a day (BID) | ORAL | Status: AC

## 2015-07-10 MED ORDER — OMEPRAZOLE 20 MG PO CPDR: 20.0000 mg | DELAYED_RELEASE_CAPSULE | Freq: Two times a day (BID) | ORAL | Status: AC

## 2015-07-10 MED ORDER — DIAZEPAM 5 MG PO TABS: 5.0000 mg | ORAL_TABLET | Freq: Two times a day (BID) | ORAL | Status: AC

## 2015-07-10 MED ORDER — HYDROCODONE-ACETAMINOPHEN 5-325 MG PO TABS: 2.0000 | ORAL_TABLET | ORAL | Status: AC | PRN
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
## 2015-07-10 NOTE — Discharge Instructions (Signed)
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
## 2016-04-25 IMAGING — CT CT HEAD W/O CM
1 series · 16 of 30 positions shown, 20 images · non-contrast
Comparison: None.

CLINICAL DATA: Headache with worsening last 2 days, no injury

EXAM:
CT HEAD WITHOUT CONTRAST
TECHNIQUE: Contiguous axial images were obtained from the base of the skull
through the vertex without intravenous contrast.

[Series 2: head 4.8 h37s · axial · 0.45mm/px · z∈[-160,-20]mm · 16 of 32 slices shown, 20 images]
[im 2/32  brain]
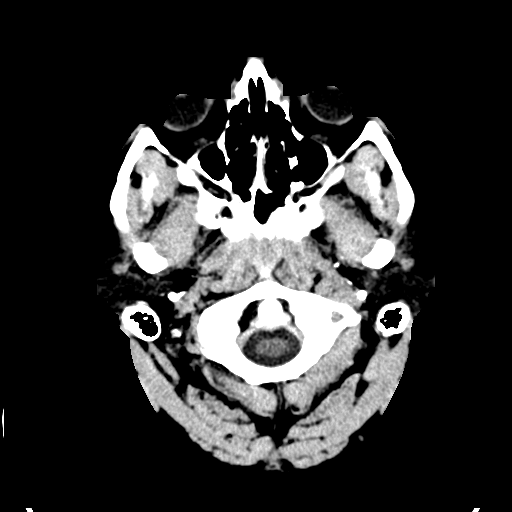
[im 2/32  bone]
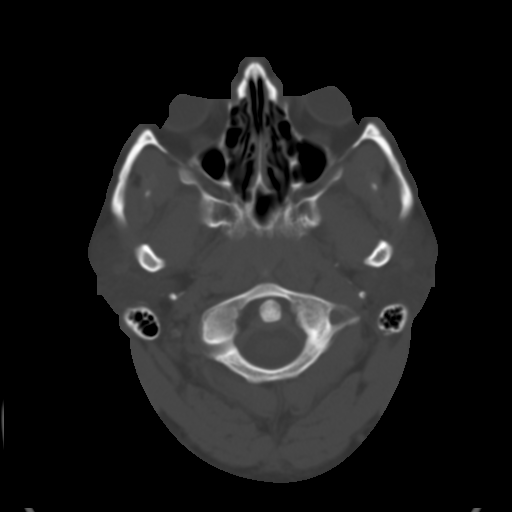
[im 4/32  brain]
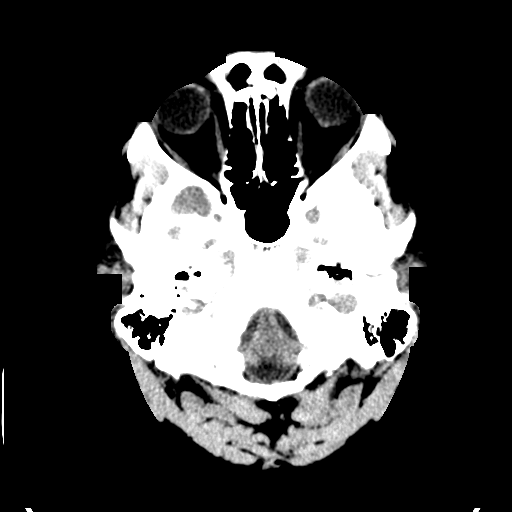
[im 6/32  brain]
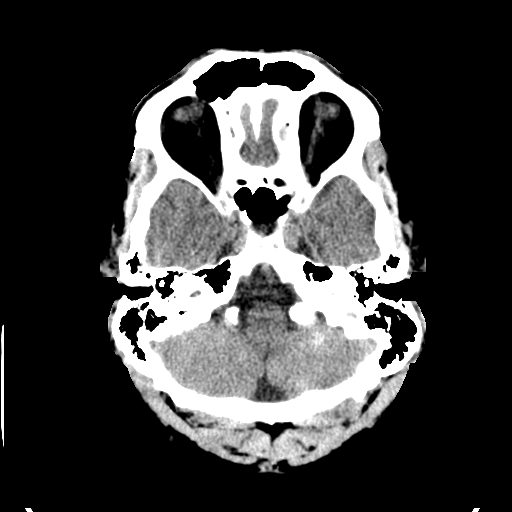
[im 8/32  brain]
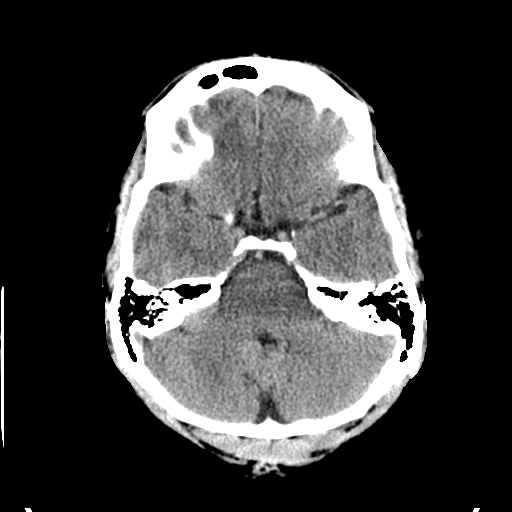
[im 9/32  brain]
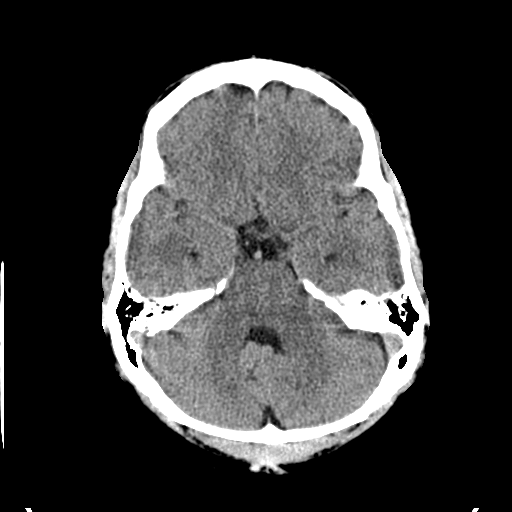
[im 9/32  bone]
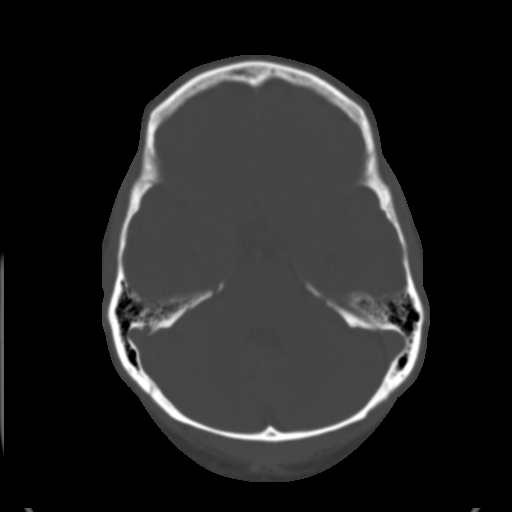
[im 11/32  brain]
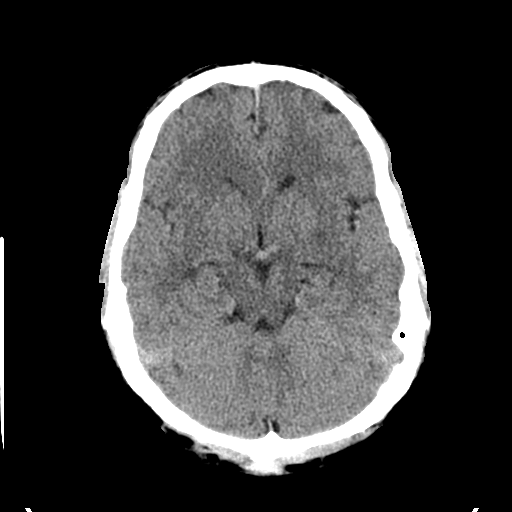
[im 13/32  brain]
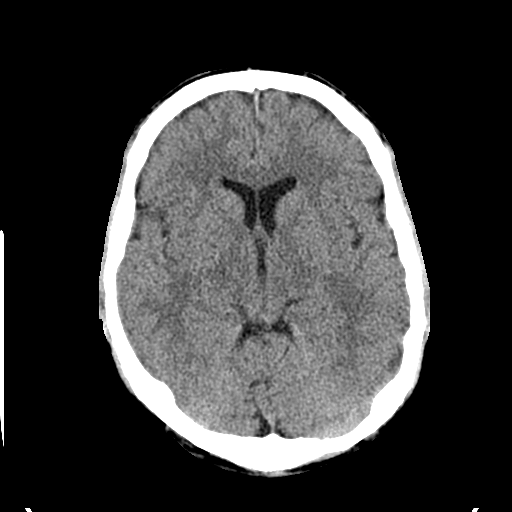
[im 15/32  brain]
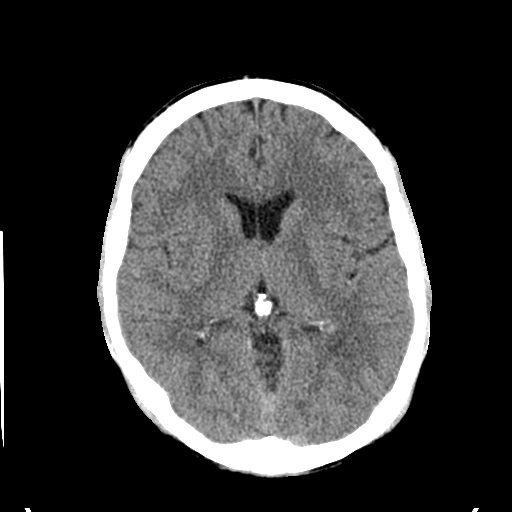
[im 17/32  brain]
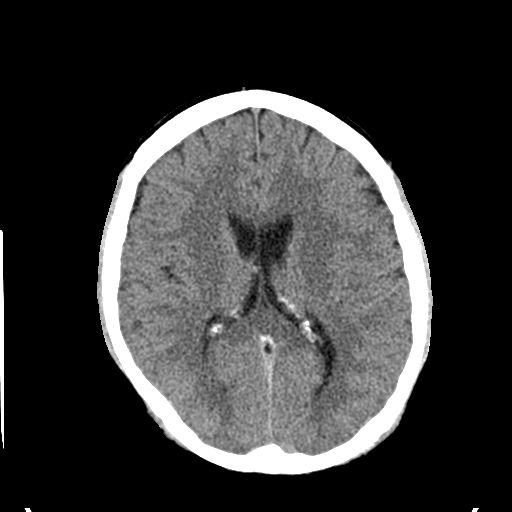
[im 17/32  bone]
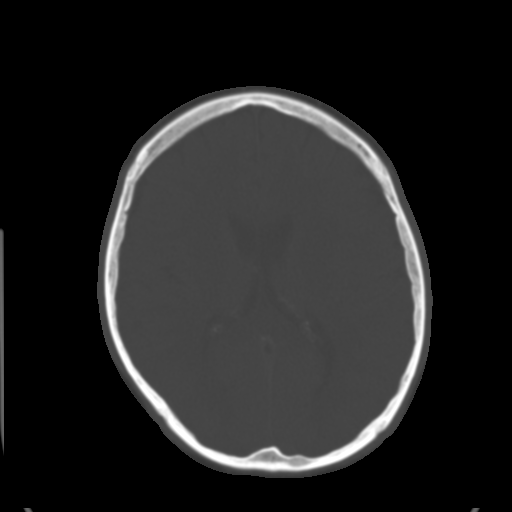
[im 19/32  brain]
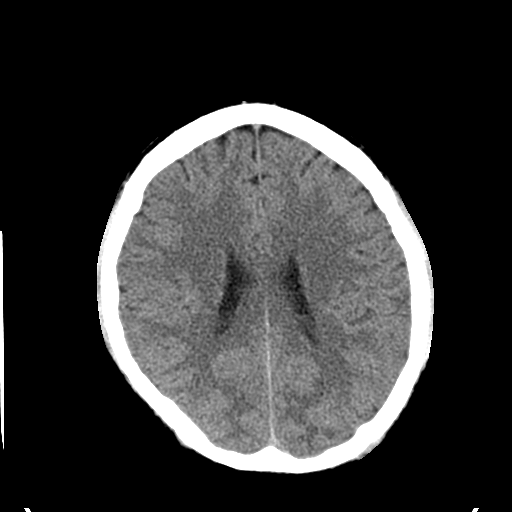
[im 21/32  brain]
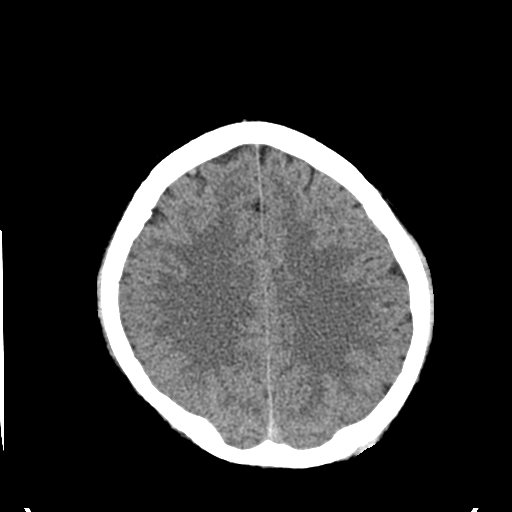
[im 23/32  brain]
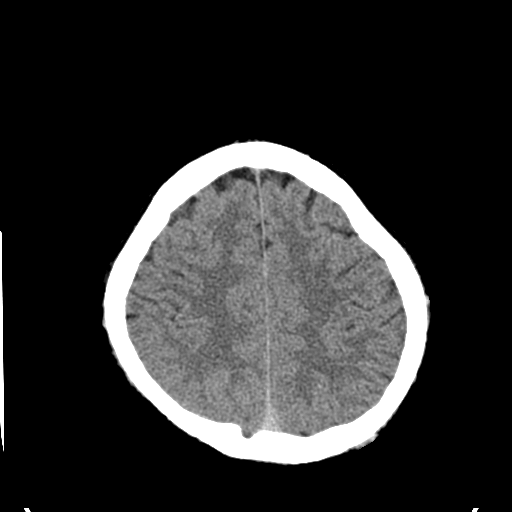
[im 24/32  brain]
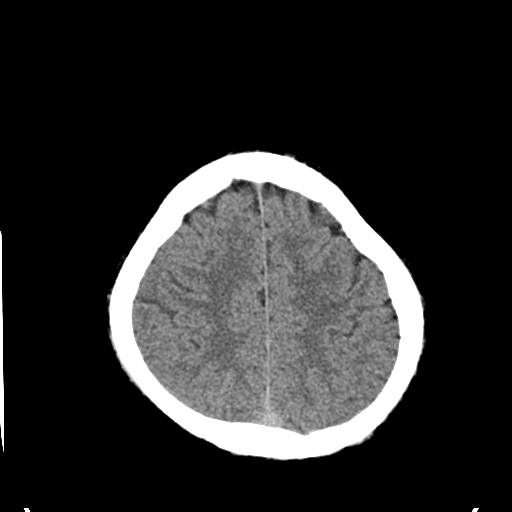
[im 24/32  bone]
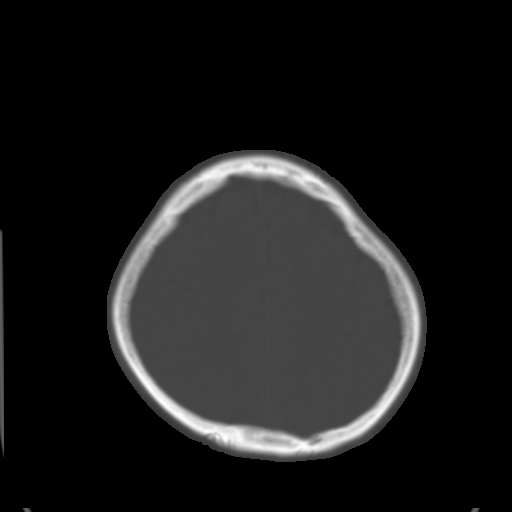
[im 26/32  brain]
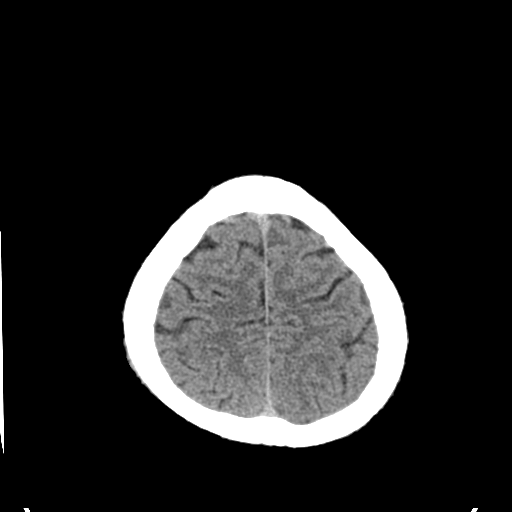
[im 28/32  brain]
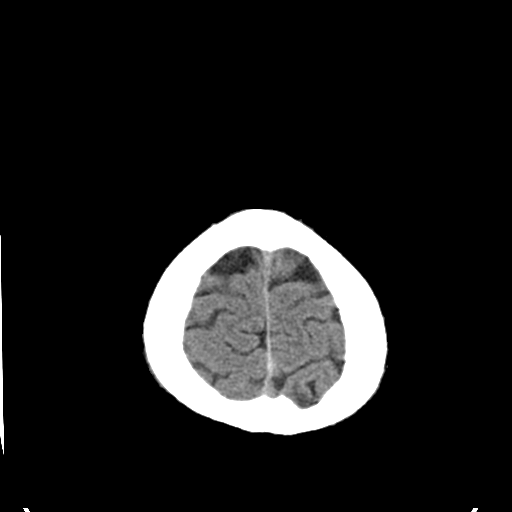
[im 30/32  brain]
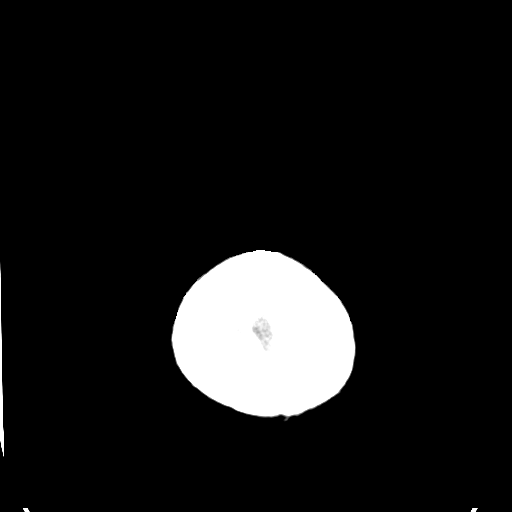

[16 of 30 positions shown; findings below may reference images not displayed]

FINDINGS: No skull fracture is noted. Paranasal sinuses and mastoid air cells
are unremarkable. No intracranial hemorrhage, mass effect or midline
shift.

No acute infarction. No mass lesion is noted on this unenhanced
scan. No hydrocephalus. The gray and white-matter differentiation is
preserved.
IMPRESSION: No acute intracranial abnormality.

## 2016-12-10 ENCOUNTER — Emergency Department (HOSPITAL_COMMUNITY)
Admission: EM | Admit: 2016-12-10 | Discharge: 2016-12-10 | Disposition: A | Discharge status: Home / Self Care | Payer: Commercial Managed Care - HMO | Attending: Emergency Medicine | Admitting: Emergency Medicine

## 2016-12-10 ENCOUNTER — Emergency Department (HOSPITAL_COMMUNITY): Payer: Commercial Managed Care - HMO

## 2016-12-10 ENCOUNTER — Encounter (HOSPITAL_COMMUNITY): Payer: Self-pay | Admitting: Nurse Practitioner

## 2016-12-10 DIAGNOSIS — F17210 Nicotine dependence, cigarettes, uncomplicated: Secondary | ICD-10-CM | POA: Insufficient documentation

## 2016-12-10 DIAGNOSIS — J111 Influenza due to unidentified influenza virus with other respiratory manifestations: Secondary | ICD-10-CM | POA: Insufficient documentation

## 2016-12-10 DIAGNOSIS — J329 Chronic sinusitis, unspecified: Secondary | ICD-10-CM | POA: Diagnosis not present

## 2016-12-10 DIAGNOSIS — R509 Fever, unspecified: Secondary | ICD-10-CM | POA: Diagnosis present

## 2016-12-10 LAB — URINALYSIS, ROUTINE W REFLEX MICROSCOPIC
Bacteria, UA: NONE SEEN
Bilirubin Urine: NEGATIVE
GLUCOSE, UA: NEGATIVE mg/dL
Ketones, ur: NEGATIVE mg/dL
Leukocytes, UA: NEGATIVE
Nitrite: NEGATIVE
PH: 5 (ref 5.0–8.0)
Protein, ur: NEGATIVE mg/dL
SQUAMOUS EPITHELIAL / LPF: NONE SEEN
Specific Gravity, Urine: 1.026 (ref 1.005–1.030)

## 2016-12-10 LAB — COMPREHENSIVE METABOLIC PANEL
ALK PHOS: 83 U/L (ref 38–126)
ALT: 37 U/L (ref 17–63)
AST: 31 U/L (ref 15–41)
Albumin: 4 g/dL (ref 3.5–5.0)
Anion gap: 7 (ref 5–15)
BUN: 11 mg/dL (ref 6–20)
CALCIUM: 9.4 mg/dL (ref 8.9–10.3)
CO2: 24 mmol/L (ref 22–32)
CREATININE: 1.06 mg/dL (ref 0.61–1.24)
Chloride: 104 mmol/L (ref 101–111)
GFR calc Af Amer: >60 mL/min (ref >60)
GFR calc non Af Amer: >60 mL/min (ref >60)
GLUCOSE: 98 mg/dL (ref 65–99)
Potassium: 3.9 mmol/L (ref 3.5–5.1)
SODIUM: 135 mmol/L (ref 135–145)
Total Bilirubin: 0.4 mg/dL (ref 0.3–1.2)
Total Protein: 7.1 g/dL (ref 6.5–8.1)

## 2016-12-10 LAB — CBC WITH DIFFERENTIAL/PLATELET
BASOS PCT: 0 %
Basophils Absolute: 0 10*3/uL (ref 0.0–0.1)
EOS ABS: 0.1 10*3/uL (ref 0.0–0.7)
Eosinophils Relative: 1 %
HCT: 43.3 % (ref 39.0–52.0)
Hemoglobin: 15.4 g/dL (ref 13.0–17.0)
LYMPHS ABS: 1.6 10*3/uL (ref 0.7–4.0)
Lymphocytes Relative: 19 %
MCH: 29.9 pg (ref 26.0–34.0)
MCHC: 35.6 g/dL (ref 30.0–36.0)
MCV: 84.1 fL (ref 78.0–100.0)
MONO ABS: 1.2 10*3/uL — AB (ref 0.1–1.0)
Monocytes Relative: 14 %
NEUTROS ABS: 5.6 10*3/uL (ref 1.7–7.7)
NEUTROS PCT: 66 %
PLATELETS: 256 10*3/uL (ref 150–400)
RBC: 5.15 MIL/uL (ref 4.22–5.81)
RDW: 13.3 % (ref 11.5–15.5)
WBC: 8.5 10*3/uL (ref 4.0–10.5)

## 2016-12-10 LAB — I-STAT CG4 LACTIC ACID, ED: Lactic Acid, Venous: 1.17 mmol/L (ref 0.5–1.9)

## 2016-12-10 NOTE — ED Triage Notes (Signed)
Pt presents with c/o flu like symptoms. The symptoms began yesterday. He c/o malaise, fevers, headaches, body aches, nasal congestion congestion, cough. He denies any nausea, vomiting, urinary symptoms, constipation, diarrhea. He has been treated with two rounds of amoxicillin and one round of levaquin for sinus infections over the past 3 months but has never had full resolution of symptoms.

## 2016-12-10 NOTE — Discharge Instructions (Signed)
Rest, push fluids, take Tylenol or Motrin for body aches. Influenza runs its course from 2-8 days.  Take Zyrtec, and Sudafed for sinus congestion.  Follow-up with her primary care physician as needed.

## 2016-12-10 NOTE — ED Provider Notes (Signed)
[facility]-EMERGENCY DEPT Provider Note   CSN: [id number] Arrival date & time: 12/10/16  1628     History   Chief Complaint Chief Complaint  Patient presents with  . Influenza    HPI Adam Ingram is a 46 y.o. male. Complaint is body aches and fever.  HPI:  She describes 48 hours of symptoms. Myalgias fever bodyaches. States had fever and chills yesterday. He felt the headache and "hurting all over". He's had an occasional dry cough. His been treated with 2 rounds of antibiotics for sinus congestion over the last month. He still feels pain and pressure in his sinuses. Dry nonproductive cough. No GI complaints.  Past Medical History:  Diagnosis Date  . Anxiety   . Depression   . Neck pain   . PTSD (post-traumatic stress disorder)     There are no active problems to display for this patient.   History reviewed. No pertinent surgical history.     Home Medications    Prior to Admission medications   Medication Sig Start Date End Date Taking? Authorizing Provider  amoxicillin (AMOXIL) 500 MG capsule Take 1 capsule (500 mg total) by mouth 3 (three) times daily. 02/01/13   Adlih Moreno-Coll, MD  diazepam (VALIUM) 5 MG tablet Take 1 tablet (5 mg total) by mouth 2 (two) times daily. 07/10/15   Rolland Porter, MD  fluticasone (FLONASE) 50 MCG/ACT nasal spray Place 2 sprays into the nose daily. Into each nostril 10/14/12   Cathlyn Parsons, NP  HYDROcodone-acetaminophen (NORCO/VICODIN) 5-325 MG per tablet Take 2 tablets by mouth every 4 (four) hours as needed. 07/10/15   Rolland Porter, MD  methocarbamol (ROBAXIN) 500 MG tablet Take 1 tablet (500 mg total) by mouth 2 (two) times daily. 07/10/15   Rolland Porter, MD  omeprazole (PRILOSEC) 20 MG capsule Take 1 capsule (20 mg total) by mouth 2 (two) times daily. 07/10/15   Rolland Porter, MD  predniSONE (DELTASONE) 20 MG tablet 2 tabs by mouth daily for 5 days 02/01/13   Sharin Grave, MD  promethazine (PHENERGAN) 25 MG tablet Take 1 tablet (25 mg total) by  mouth every 6 (six) hours as needed for nausea or vomiting. 10/07/14   Vanetta Mulders, MD  traMADol (ULTRAM) 50 MG tablet Take 1 tablet (50 mg total) by mouth every 6 (six) hours as needed. 10/07/14   Vanetta Mulders, MD    Family History History reviewed. No pertinent family history.  Social History Social History  Substance Use Topics  . Smoking status: Current Every Day Smoker    Packs/day: 0.50    Types: Cigarettes  . Smokeless tobacco: Never Used  . Alcohol use No     Allergies   Patient has no known allergies.   Review of Systems Review of Systems  Constitutional: Negative for appetite change, chills, diaphoresis, fatigue and fever.  HENT: Positive for sinus pain, sinus pressure and sore throat. Negative for mouth sores and trouble swallowing.   Eyes: Negative for visual disturbance.  Respiratory: Negative for cough, chest tightness, shortness of breath and wheezing.   Cardiovascular: Negative for chest pain.  Gastrointestinal: Negative for abdominal distention, abdominal pain, diarrhea, nausea and vomiting.  Endocrine: Negative for polydipsia, polyphagia and polyuria.  Genitourinary: Negative for dysuria, frequency and hematuria.  Musculoskeletal: Positive for myalgias. Negative for gait problem.  Skin: Negative for color change, pallor and rash.  Neurological: Positive for headaches. Negative for dizziness, syncope and light-headedness.  Hematological: Does not bruise/bleed easily.  Psychiatric/Behavioral: Negative for behavioral problems and confusion.  Physical Exam Updated Vital Signs BP 147/97   Pulse 94   Temp 99.3 F (37.4 C) (Oral)   Resp 16   SpO2 98%   Physical Exam  Constitutional: He is oriented to person, place, and time. He appears well-developed and well-nourished. No distress.  HENT:  Head: Normocephalic.  Eyes: Conjunctivae are normal. Pupils are equal, round, and reactive to light. No scleral icterus.  Neck: Normal range of motion.  Neck supple. No thyromegaly present.  Cardiovascular: Normal rate and regular rhythm.  Exam reveals no gallop and no friction rub.   No murmur heard. Pulmonary/Chest: Effort normal and breath sounds normal. No respiratory distress. He has no wheezes. He has no rales.  Lungs clear to auscultation.. No wheezing rales or rhonchi.  Abdominal: Soft. Bowel sounds are normal. He exhibits no distension. There is no tenderness. There is no rebound.  Musculoskeletal: Normal range of motion.  Neurological: He is alert and oriented to person, place, and time.  Skin: Skin is warm and dry. No rash noted.  Psychiatric: He has a normal mood and affect. His behavior is normal.     ED Treatments / Results  Labs (all labs ordered are listed, but only abnormal results are displayed) Labs Reviewed  CBC WITH DIFFERENTIAL/PLATELET - Abnormal; Notable for the following:       Result Value   Monocytes Absolute 1.2 (*)    All other components within normal limits  COMPREHENSIVE METABOLIC PANEL  URINALYSIS, ROUTINE W REFLEX MICROSCOPIC  I-STAT CG4 LACTIC ACID, ED    EKG  EKG Interpretation None       Radiology Dg Chest 2 View  Result Date: 12/10/2016 CLINICAL DATA:  Fever and dry cough EXAM: CHEST  2 VIEW COMPARISON:  08/07/2012 FINDINGS: The heart size and mediastinal contours are within normal limits. Both lungs are clear. The visualized skeletal structures are unremarkable. IMPRESSION: No active cardiopulmonary disease. Electronically Signed   By: Alcide CleverMark  Lukens M.D.   On: 12/10/2016 19:00    Procedures Procedures (including critical care time)  Medications Ordered in ED Medications - No data to display   Initial Impression / Assessment and Plan / ED Course  I have reviewed the triage vital signs and the nursing notes.  Pertinent labs & imaging results that were available during my care of the patient were reviewed by me and considered in my medical decision making (see chart for details).      Patient was immunized for influenza. However, I suspect influenza as a source. He has classic symptoms of fevers bodyaches myalgias headache sore throat. No symptoms or findings to suggest pneumonia clinically or by x-ray. Reassuring labs. His been treated 2 rounds of antibiotics for his sinuses. I recommended an antihistamine and decongestant and primary care follow-up. Regarding his flu we discussed Tamiflu and he politely declines. I think this is reasonable. Recommend anti-inflammatories or Tylenol, push fluids, rest. Slowly increase activity as symptoms improve. Primary care follow-up.  Final Clinical Impressions(s) / ED Diagnoses   Final diagnoses:  Influenza  Sinusitis, unspecified chronicity, unspecified location    New Prescriptions New Prescriptions   No medications on file     Rolland PorterMark Majd Tissue, MD 12/10/16 2156

## 2018-02-13 ENCOUNTER — Encounter (HOSPITAL_COMMUNITY): Payer: Self-pay | Admitting: Student

## 2018-02-13 ENCOUNTER — Emergency Department (HOSPITAL_COMMUNITY)
Admission: EM | Admit: 2018-02-13 | Discharge: 2018-02-13 | Disposition: A | Discharge status: Home / Self Care | Payer: Medicaid Other | Attending: Physician Assistant | Admitting: Physician Assistant

## 2018-02-13 ENCOUNTER — Other Ambulatory Visit: Payer: Self-pay

## 2018-02-13 DIAGNOSIS — F22 Delusional disorders: Secondary | ICD-10-CM | POA: Insufficient documentation

## 2018-02-13 DIAGNOSIS — Z046 Encounter for general psychiatric examination, requested by authority: Secondary | ICD-10-CM | POA: Diagnosis not present

## 2018-02-13 DIAGNOSIS — F419 Anxiety disorder, unspecified: Secondary | ICD-10-CM

## 2018-02-13 DIAGNOSIS — R45851 Suicidal ideations: Secondary | ICD-10-CM | POA: Diagnosis not present

## 2018-02-13 DIAGNOSIS — G479 Sleep disorder, unspecified: Secondary | ICD-10-CM | POA: Insufficient documentation

## 2018-02-13 DIAGNOSIS — F17210 Nicotine dependence, cigarettes, uncomplicated: Secondary | ICD-10-CM | POA: Insufficient documentation

## 2018-02-13 LAB — CBC
HEMATOCRIT: 47.4 % (ref 39.0–52.0)
Hemoglobin: 17.2 g/dL — ABNORMAL HIGH (ref 13.0–17.0)
MCH: 30.9 pg (ref 26.0–34.0)
MCHC: 36.3 g/dL — ABNORMAL HIGH (ref 30.0–36.0)
MCV: 85.3 fL (ref 78.0–100.0)
PLATELETS: 326 10*3/uL (ref 150–400)
RBC: 5.56 MIL/uL (ref 4.22–5.81)
RDW: 13.4 % (ref 11.5–15.5)
WBC: 12.8 10*3/uL — AB (ref 4.0–10.5)

## 2018-02-13 LAB — COMPREHENSIVE METABOLIC PANEL
ALT: 35 U/L (ref 17–63)
AST: 27 U/L (ref 15–41)
Albumin: 4.8 g/dL (ref 3.5–5.0)
Alkaline Phosphatase: 90 U/L (ref 38–126)
Anion gap: 11 (ref 5–15)
BILIRUBIN TOTAL: 0.8 mg/dL (ref 0.3–1.2)
BUN: 11 mg/dL (ref 6–20)
CO2: 23 mmol/L (ref 22–32)
Calcium: 9.9 mg/dL (ref 8.9–10.3)
Chloride: 104 mmol/L (ref 101–111)
Creatinine, Ser: 0.88 mg/dL (ref 0.61–1.24)
Glucose, Bld: 100 mg/dL — ABNORMAL HIGH (ref 65–99)
POTASSIUM: 4 mmol/L (ref 3.5–5.1)
Sodium: 138 mmol/L (ref 135–145)
TOTAL PROTEIN: 8.3 g/dL — AB (ref 6.5–8.1)

## 2018-02-13 LAB — RAPID URINE DRUG SCREEN, HOSP PERFORMED
Amphetamines: NOT DETECTED
BENZODIAZEPINES: NOT DETECTED
Barbiturates: NOT DETECTED
COCAINE: NOT DETECTED
Opiates: NOT DETECTED
Tetrahydrocannabinol: POSITIVE — AB

## 2018-02-13 MED ORDER — LORAZEPAM 2 MG/ML IJ SOLN
1.0000 mg | Freq: Once | INTRAMUSCULAR | Status: AC
  Administered 2018-02-13: 1 mg via INTRAMUSCULAR
  Filled 2018-02-13: qty 1

## 2018-02-13 MED ORDER — LORAZEPAM 2 MG/ML IJ SOLN: 2.0000 mg | Freq: Once | INTRAMUSCULAR | Status: DC

## 2018-02-13 NOTE — Discharge Instructions (Signed)
Follow-up with your therapist as directed.  I have also provided outpatient resources he can follow-up with.  Return to the emergency department for any worsening thoughts of anxiety, thoughts of wanting her to kill yourself, thoughts of wanting her to get anybody else, any other worsening or concerning symptoms.
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
## 2018-02-13 NOTE — ED Notes (Signed)
Pt anxious about leaving, IM Ativan given, pt wants to leave, PA at bedside. Order given to d/c, belongings given to Pt, son's at the bedside to take him home. I will continue to monitor.
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
## 2018-02-13 NOTE — [facility] Counselor (Addendum)
Pt gave verbal consent to release all chart information to his counselor, Harl Favordnalyn Hurley, [phone]
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
## 2018-06-29 IMAGING — DX DG CHEST 2V
2 series · 2 of 2 positions shown · non-contrast
Comparison: 08/07/2012

CLINICAL DATA: Fever and dry cough

EXAM:
CHEST  2 VIEW

[w chest pa]
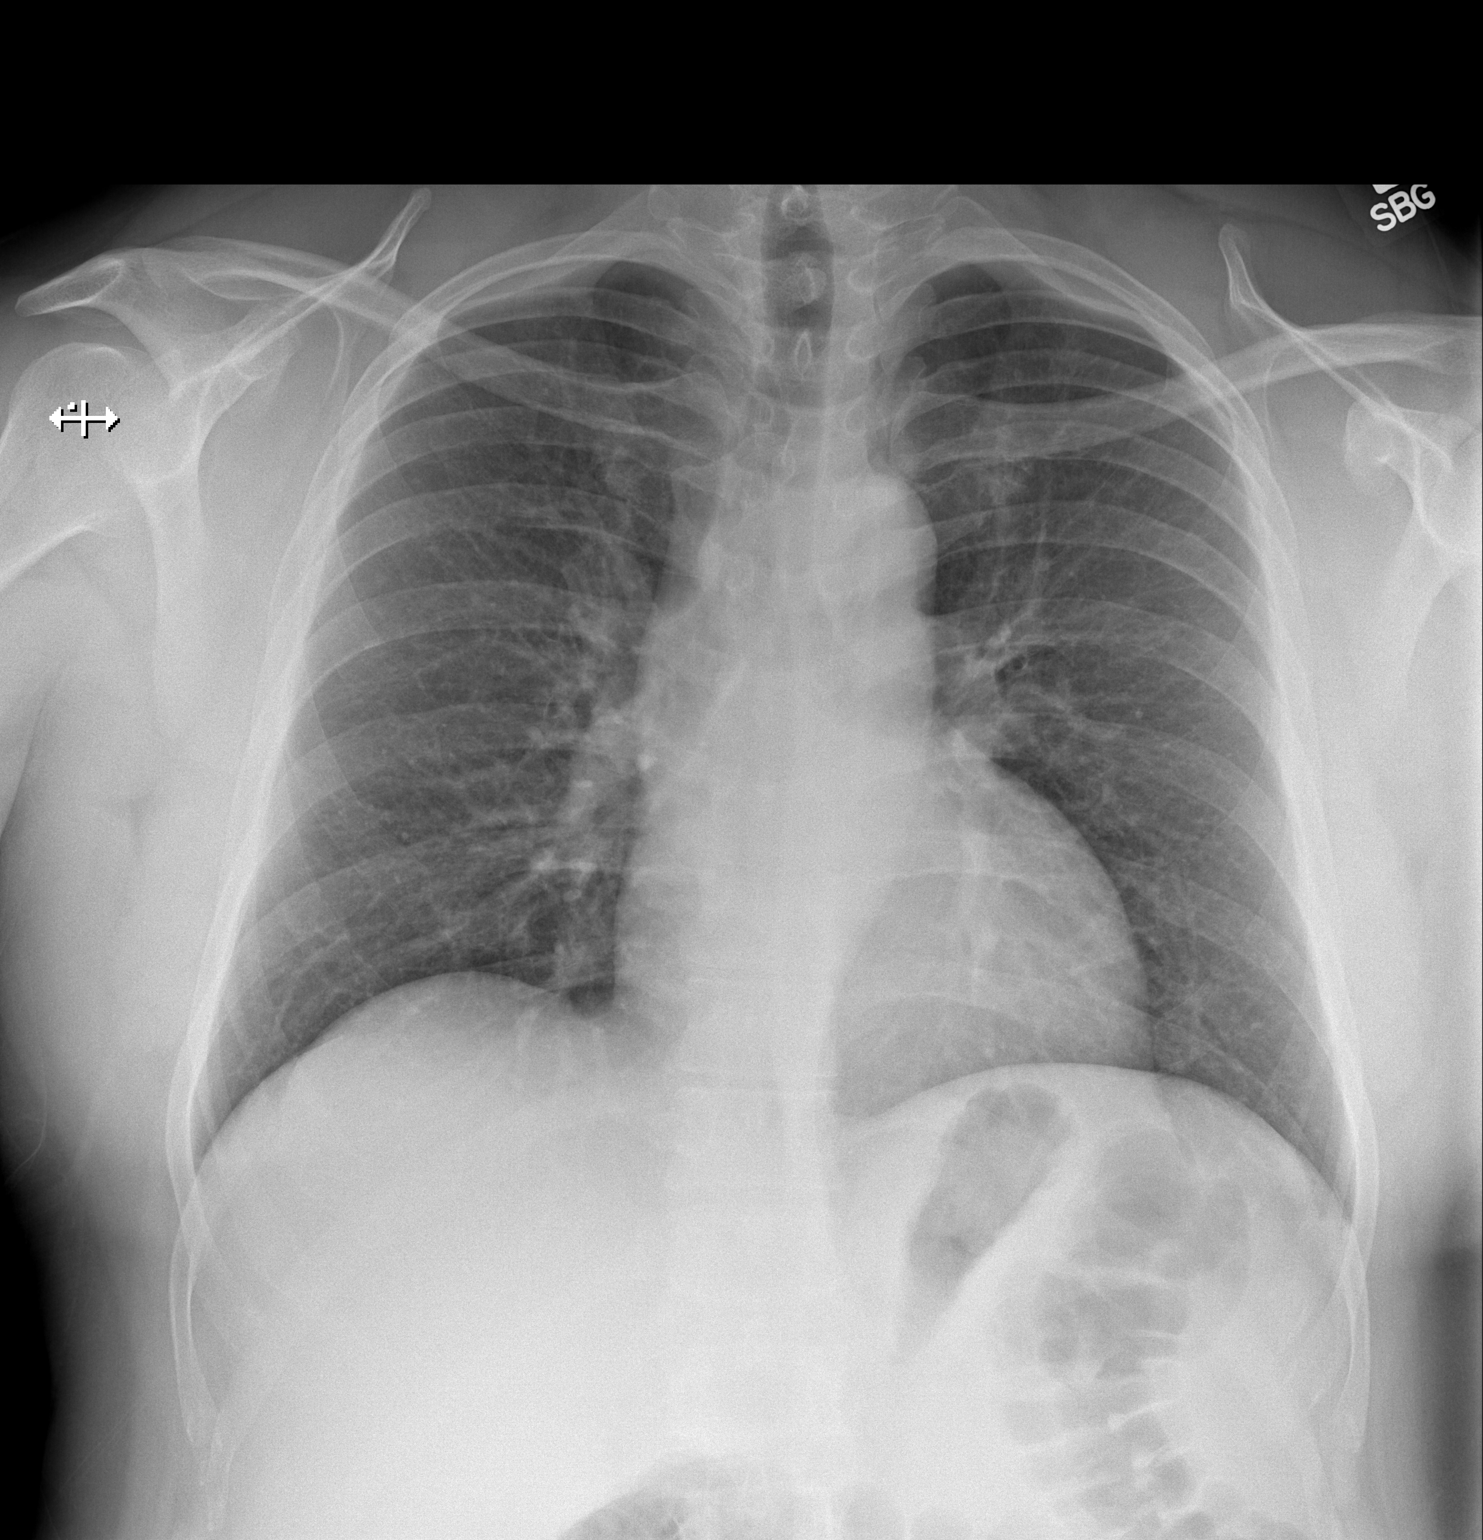

[w chest lat]
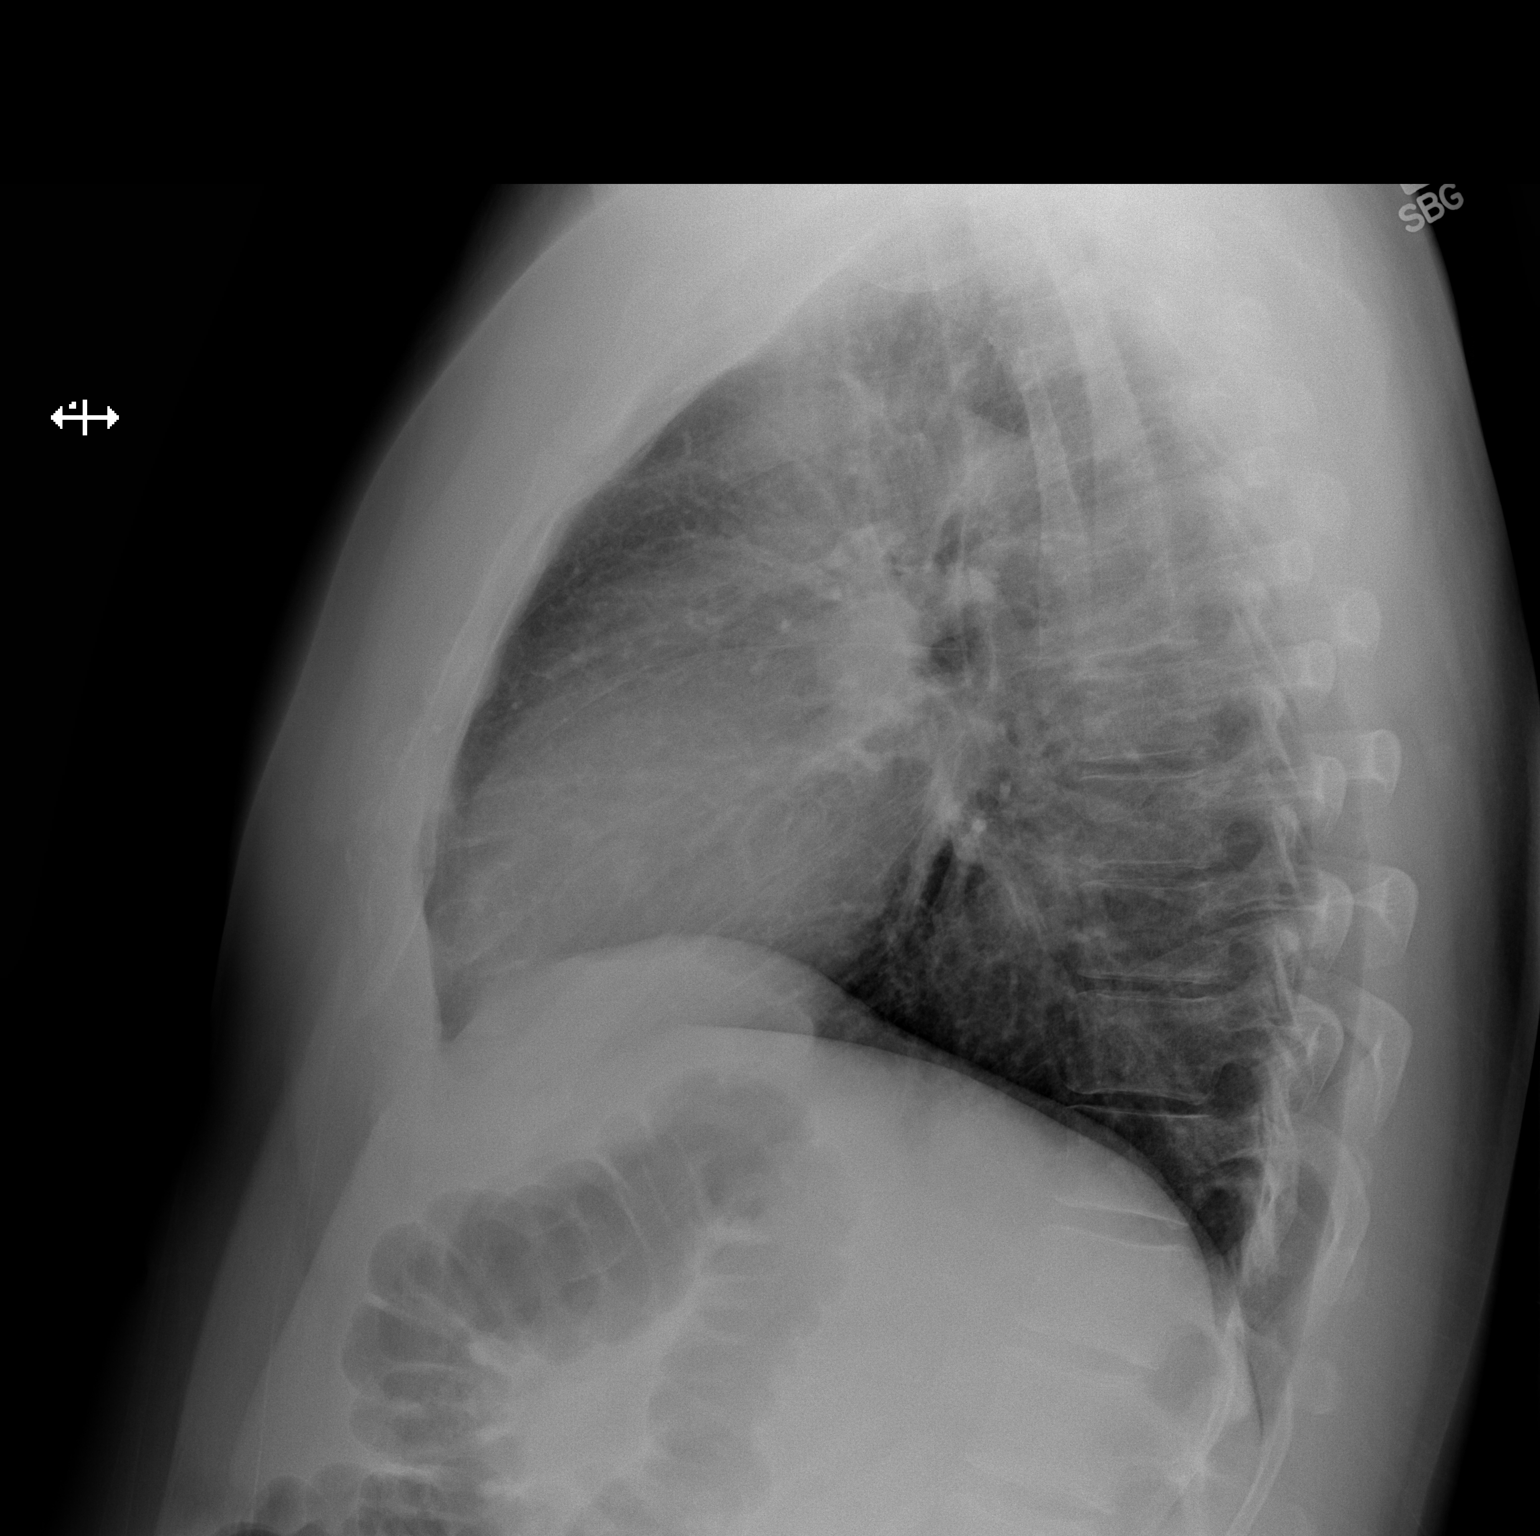

[2 of 2 positions shown; findings below may reference images not displayed]

FINDINGS: The heart size and mediastinal contours are within normal limits.
Both lungs are clear. The visualized skeletal structures are
unremarkable.
IMPRESSION: No active cardiopulmonary disease.
